# Patient Record
Sex: Female | Born: 1950 | Race: White | Hispanic: No | Marital: Single
Health system: Southern US, Community
[De-identification: ages and names within clinical notes are randomized; demographics above are authoritative.]

---

## 2006-08-21 ENCOUNTER — Other Ambulatory Visit: Admission: RE | Admit: 2006-08-21 | Discharge: 2006-08-21 | Payer: Self-pay | Admitting: *Deleted

## 2011-04-21 ENCOUNTER — Ambulatory Visit (HOSPITAL_COMMUNITY)
Admission: RE | Admit: 2011-04-21 | Discharge: 2011-04-21 | Disposition: A | Discharge status: Home / Self Care | Payer: Self-pay | Source: Ambulatory Visit | Attending: Obstetrics & Gynecology | Admitting: Obstetrics & Gynecology

## 2011-04-21 ENCOUNTER — Other Ambulatory Visit: Payer: Self-pay | Admitting: Obstetrics & Gynecology

## 2011-04-21 DIAGNOSIS — Z01818 Encounter for other preprocedural examination: Secondary | ICD-10-CM

## 2012-04-17 ENCOUNTER — Other Ambulatory Visit (HOSPITAL_COMMUNITY)
Admission: RE | Admit: 2012-04-17 | Discharge: 2012-04-17 | Disposition: A | Discharge status: Home / Self Care | Payer: BC Managed Care – PPO | Source: Ambulatory Visit | Attending: Family Medicine | Admitting: Family Medicine

## 2012-04-17 DIAGNOSIS — Z01419 Encounter for gynecological examination (general) (routine) without abnormal findings: Secondary | ICD-10-CM | POA: Insufficient documentation

## 2013-05-19 ENCOUNTER — Other Ambulatory Visit: Payer: Self-pay | Admitting: Family Medicine

## 2013-05-19 DIAGNOSIS — Z1231 Encounter for screening mammogram for malignant neoplasm of breast: Secondary | ICD-10-CM

## 2013-06-11 ENCOUNTER — Ambulatory Visit
Admission: RE | Admit: 2013-06-11 | Discharge: 2013-06-11 | Disposition: A | Discharge status: Home / Self Care | Payer: BC Managed Care – PPO | Source: Ambulatory Visit | Attending: Family Medicine | Admitting: Family Medicine

## 2013-06-11 DIAGNOSIS — Z1231 Encounter for screening mammogram for malignant neoplasm of breast: Secondary | ICD-10-CM

## 2013-06-13 ENCOUNTER — Other Ambulatory Visit: Payer: Self-pay | Admitting: Family Medicine

## 2013-06-13 DIAGNOSIS — R928 Other abnormal and inconclusive findings on diagnostic imaging of breast: Secondary | ICD-10-CM

## 2013-06-18 ENCOUNTER — Ambulatory Visit
Admission: RE | Admit: 2013-06-18 | Discharge: 2013-06-18 | Disposition: A | Discharge status: Home / Self Care | Payer: BC Managed Care – PPO | Source: Ambulatory Visit | Attending: Family Medicine | Admitting: Family Medicine

## 2013-06-18 ENCOUNTER — Other Ambulatory Visit: Payer: Self-pay | Admitting: Radiology

## 2013-06-18 ENCOUNTER — Other Ambulatory Visit: Payer: Self-pay | Admitting: Family Medicine

## 2013-06-18 DIAGNOSIS — R928 Other abnormal and inconclusive findings on diagnostic imaging of breast: Secondary | ICD-10-CM

## 2013-12-02 ENCOUNTER — Other Ambulatory Visit: Payer: Self-pay | Admitting: Family Medicine

## 2013-12-02 DIAGNOSIS — N6320 Unspecified lump in the left breast, unspecified quadrant: Secondary | ICD-10-CM

## 2013-12-17 ENCOUNTER — Ambulatory Visit
Admission: RE | Admit: 2013-12-17 | Discharge: 2013-12-17 | Disposition: A | Discharge status: Home / Self Care | Payer: BC Managed Care – PPO | Source: Ambulatory Visit | Attending: Family Medicine | Admitting: Family Medicine

## 2013-12-17 ENCOUNTER — Other Ambulatory Visit: Payer: Self-pay | Admitting: Family Medicine

## 2013-12-17 DIAGNOSIS — N6320 Unspecified lump in the left breast, unspecified quadrant: Secondary | ICD-10-CM

## 2015-07-02 ENCOUNTER — Other Ambulatory Visit (HOSPITAL_COMMUNITY)
Admission: RE | Admit: 2015-07-02 | Discharge: 2015-07-02 | Disposition: A | Discharge status: Home / Self Care | Payer: BLUE CROSS/BLUE SHIELD | Source: Ambulatory Visit | Attending: Family Medicine | Admitting: Family Medicine

## 2015-07-02 ENCOUNTER — Other Ambulatory Visit: Payer: Self-pay | Admitting: Family Medicine

## 2015-07-02 DIAGNOSIS — Z1151 Encounter for screening for human papillomavirus (HPV): Secondary | ICD-10-CM | POA: Insufficient documentation

## 2015-07-02 DIAGNOSIS — Z124 Encounter for screening for malignant neoplasm of cervix: Secondary | ICD-10-CM | POA: Diagnosis present

## 2015-07-07 LAB — CYTOLOGY - PAP

## 2016-09-08 DIAGNOSIS — Z1211 Encounter for screening for malignant neoplasm of colon: Secondary | ICD-10-CM | POA: Diagnosis not present

## 2016-09-08 DIAGNOSIS — Z23 Encounter for immunization: Secondary | ICD-10-CM | POA: Diagnosis not present

## 2016-09-08 DIAGNOSIS — E669 Obesity, unspecified: Secondary | ICD-10-CM | POA: Diagnosis not present

## 2016-09-08 DIAGNOSIS — I10 Essential (primary) hypertension: Secondary | ICD-10-CM | POA: Diagnosis not present

## 2016-09-08 DIAGNOSIS — Z0000 Encounter for general adult medical examination without abnormal findings: Secondary | ICD-10-CM | POA: Diagnosis not present

## 2016-09-08 DIAGNOSIS — R7301 Impaired fasting glucose: Secondary | ICD-10-CM | POA: Diagnosis not present

## 2016-09-11 ENCOUNTER — Other Ambulatory Visit: Payer: Self-pay | Admitting: Family Medicine

## 2016-09-11 DIAGNOSIS — N6320 Unspecified lump in the left breast, unspecified quadrant: Secondary | ICD-10-CM

## 2016-10-05 DIAGNOSIS — I10 Essential (primary) hypertension: Secondary | ICD-10-CM | POA: Diagnosis not present

## 2016-10-19 DIAGNOSIS — E871 Hypo-osmolality and hyponatremia: Secondary | ICD-10-CM | POA: Diagnosis not present

## 2016-11-14 DIAGNOSIS — Z780 Asymptomatic menopausal state: Secondary | ICD-10-CM | POA: Diagnosis not present

## 2017-02-20 ENCOUNTER — Other Ambulatory Visit: Payer: Self-pay | Admitting: Family Medicine

## 2017-02-20 DIAGNOSIS — N6320 Unspecified lump in the left breast, unspecified quadrant: Secondary | ICD-10-CM

## 2017-02-21 ENCOUNTER — Ambulatory Visit
Admission: RE | Admit: 2017-02-21 | Discharge: 2017-02-21 | Disposition: A | Discharge status: Home / Self Care | Payer: PPO | Source: Ambulatory Visit | Attending: Family Medicine | Admitting: Family Medicine

## 2017-02-21 ENCOUNTER — Ambulatory Visit: Payer: BLUE CROSS/BLUE SHIELD

## 2017-02-21 DIAGNOSIS — N6320 Unspecified lump in the left breast, unspecified quadrant: Secondary | ICD-10-CM

## 2017-02-21 DIAGNOSIS — R928 Other abnormal and inconclusive findings on diagnostic imaging of breast: Secondary | ICD-10-CM | POA: Diagnosis not present

## 2017-09-26 DIAGNOSIS — I10 Essential (primary) hypertension: Secondary | ICD-10-CM | POA: Diagnosis not present

## 2017-09-26 DIAGNOSIS — Z0000 Encounter for general adult medical examination without abnormal findings: Secondary | ICD-10-CM | POA: Diagnosis not present

## 2017-09-26 DIAGNOSIS — R7301 Impaired fasting glucose: Secondary | ICD-10-CM | POA: Diagnosis not present

## 2017-09-26 DIAGNOSIS — Z1159 Encounter for screening for other viral diseases: Secondary | ICD-10-CM | POA: Diagnosis not present

## 2017-09-26 DIAGNOSIS — Z23 Encounter for immunization: Secondary | ICD-10-CM | POA: Diagnosis not present

## 2018-10-16 DIAGNOSIS — I10 Essential (primary) hypertension: Secondary | ICD-10-CM | POA: Diagnosis not present

## 2018-10-16 DIAGNOSIS — R7301 Impaired fasting glucose: Secondary | ICD-10-CM | POA: Diagnosis not present

## 2018-10-16 DIAGNOSIS — Z0000 Encounter for general adult medical examination without abnormal findings: Secondary | ICD-10-CM | POA: Diagnosis not present

## 2018-11-04 DIAGNOSIS — N179 Acute kidney failure, unspecified: Secondary | ICD-10-CM | POA: Diagnosis not present

## 2018-11-20 DIAGNOSIS — I471 Supraventricular tachycardia: Secondary | ICD-10-CM | POA: Diagnosis not present

## 2018-11-20 DIAGNOSIS — I10 Essential (primary) hypertension: Secondary | ICD-10-CM | POA: Diagnosis not present

## 2018-11-20 DIAGNOSIS — I472 Ventricular tachycardia: Secondary | ICD-10-CM | POA: Diagnosis not present

## 2019-07-16 ENCOUNTER — Other Ambulatory Visit: Payer: Self-pay | Admitting: Family Medicine

## 2019-07-16 DIAGNOSIS — Z1231 Encounter for screening mammogram for malignant neoplasm of breast: Secondary | ICD-10-CM

## 2019-10-30 DIAGNOSIS — I10 Essential (primary) hypertension: Secondary | ICD-10-CM | POA: Diagnosis not present

## 2019-10-30 DIAGNOSIS — Z1211 Encounter for screening for malignant neoplasm of colon: Secondary | ICD-10-CM | POA: Diagnosis not present

## 2019-10-30 DIAGNOSIS — Z0000 Encounter for general adult medical examination without abnormal findings: Secondary | ICD-10-CM | POA: Diagnosis not present

## 2019-10-30 DIAGNOSIS — R7301 Impaired fasting glucose: Secondary | ICD-10-CM | POA: Diagnosis not present

## 2019-12-31 ENCOUNTER — Ambulatory Visit
Admission: RE | Admit: 2019-12-31 | Discharge: 2019-12-31 | Disposition: A | Discharge status: Home / Self Care | Payer: PPO | Source: Ambulatory Visit | Attending: Family Medicine | Admitting: Family Medicine

## 2019-12-31 ENCOUNTER — Other Ambulatory Visit: Payer: Self-pay

## 2019-12-31 DIAGNOSIS — Z1231 Encounter for screening mammogram for malignant neoplasm of breast: Secondary | ICD-10-CM | POA: Diagnosis not present

## 2020-07-02 DIAGNOSIS — Z23 Encounter for immunization: Secondary | ICD-10-CM | POA: Diagnosis not present

## 2020-07-02 DIAGNOSIS — M25559 Pain in unspecified hip: Secondary | ICD-10-CM | POA: Diagnosis not present

## 2020-07-02 DIAGNOSIS — I10 Essential (primary) hypertension: Secondary | ICD-10-CM | POA: Diagnosis not present

## 2020-11-09 ENCOUNTER — Other Ambulatory Visit: Payer: Self-pay | Admitting: Family Medicine

## 2020-11-09 DIAGNOSIS — Z1231 Encounter for screening mammogram for malignant neoplasm of breast: Secondary | ICD-10-CM

## 2020-11-09 DIAGNOSIS — I10 Essential (primary) hypertension: Secondary | ICD-10-CM | POA: Diagnosis not present

## 2020-11-09 DIAGNOSIS — Z1211 Encounter for screening for malignant neoplasm of colon: Secondary | ICD-10-CM | POA: Diagnosis not present

## 2020-11-09 DIAGNOSIS — R7301 Impaired fasting glucose: Secondary | ICD-10-CM | POA: Diagnosis not present

## 2020-11-09 DIAGNOSIS — Z0000 Encounter for general adult medical examination without abnormal findings: Secondary | ICD-10-CM | POA: Diagnosis not present

## 2020-11-09 DIAGNOSIS — M545 Low back pain, unspecified: Secondary | ICD-10-CM | POA: Diagnosis not present

## 2020-11-16 DIAGNOSIS — R2689 Other abnormalities of gait and mobility: Secondary | ICD-10-CM | POA: Diagnosis not present

## 2020-11-16 DIAGNOSIS — M6281 Muscle weakness (generalized): Secondary | ICD-10-CM | POA: Diagnosis not present

## 2020-11-16 DIAGNOSIS — M25551 Pain in right hip: Secondary | ICD-10-CM | POA: Diagnosis not present

## 2020-11-19 DIAGNOSIS — M25551 Pain in right hip: Secondary | ICD-10-CM | POA: Diagnosis not present

## 2020-11-19 DIAGNOSIS — R2689 Other abnormalities of gait and mobility: Secondary | ICD-10-CM | POA: Diagnosis not present

## 2020-11-19 DIAGNOSIS — M6281 Muscle weakness (generalized): Secondary | ICD-10-CM | POA: Diagnosis not present

## 2020-11-23 DIAGNOSIS — R2689 Other abnormalities of gait and mobility: Secondary | ICD-10-CM | POA: Diagnosis not present

## 2020-11-23 DIAGNOSIS — M6281 Muscle weakness (generalized): Secondary | ICD-10-CM | POA: Diagnosis not present

## 2020-11-23 DIAGNOSIS — M25551 Pain in right hip: Secondary | ICD-10-CM | POA: Diagnosis not present

## 2020-11-25 DIAGNOSIS — R2689 Other abnormalities of gait and mobility: Secondary | ICD-10-CM | POA: Diagnosis not present

## 2020-11-25 DIAGNOSIS — M6281 Muscle weakness (generalized): Secondary | ICD-10-CM | POA: Diagnosis not present

## 2020-11-25 DIAGNOSIS — M25551 Pain in right hip: Secondary | ICD-10-CM | POA: Diagnosis not present

## 2020-11-30 DIAGNOSIS — R2689 Other abnormalities of gait and mobility: Secondary | ICD-10-CM | POA: Diagnosis not present

## 2020-11-30 DIAGNOSIS — M25551 Pain in right hip: Secondary | ICD-10-CM | POA: Diagnosis not present

## 2020-11-30 DIAGNOSIS — M6281 Muscle weakness (generalized): Secondary | ICD-10-CM | POA: Diagnosis not present

## 2020-12-07 DIAGNOSIS — R2689 Other abnormalities of gait and mobility: Secondary | ICD-10-CM | POA: Diagnosis not present

## 2020-12-07 DIAGNOSIS — M6281 Muscle weakness (generalized): Secondary | ICD-10-CM | POA: Diagnosis not present

## 2020-12-07 DIAGNOSIS — M25551 Pain in right hip: Secondary | ICD-10-CM | POA: Diagnosis not present

## 2020-12-21 DIAGNOSIS — R2689 Other abnormalities of gait and mobility: Secondary | ICD-10-CM | POA: Diagnosis not present

## 2020-12-21 DIAGNOSIS — M25551 Pain in right hip: Secondary | ICD-10-CM | POA: Diagnosis not present

## 2020-12-21 DIAGNOSIS — M6281 Muscle weakness (generalized): Secondary | ICD-10-CM | POA: Diagnosis not present

## 2020-12-30 ENCOUNTER — Ambulatory Visit
Admission: RE | Admit: 2020-12-30 | Discharge: 2020-12-30 | Disposition: A | Discharge status: Home / Self Care | Payer: PPO | Source: Ambulatory Visit | Attending: Family Medicine | Admitting: Family Medicine

## 2020-12-30 ENCOUNTER — Other Ambulatory Visit: Payer: Self-pay

## 2020-12-30 DIAGNOSIS — Z1231 Encounter for screening mammogram for malignant neoplasm of breast: Secondary | ICD-10-CM

## 2020-12-31 ENCOUNTER — Ambulatory Visit: Payer: PPO

## 2020-12-31 ENCOUNTER — Ambulatory Visit
Admission: RE | Admit: 2020-12-31 | Discharge: 2020-12-31 | Disposition: A | Discharge status: Home / Self Care | Payer: PPO | Source: Ambulatory Visit | Attending: Family Medicine | Admitting: Family Medicine

## 2020-12-31 ENCOUNTER — Other Ambulatory Visit: Payer: Self-pay

## 2020-12-31 DIAGNOSIS — Z1231 Encounter for screening mammogram for malignant neoplasm of breast: Secondary | ICD-10-CM | POA: Diagnosis not present

## 2021-01-01 ENCOUNTER — Ambulatory Visit: Payer: PPO

## 2021-01-04 DIAGNOSIS — M6281 Muscle weakness (generalized): Secondary | ICD-10-CM | POA: Diagnosis not present

## 2021-01-04 DIAGNOSIS — R2689 Other abnormalities of gait and mobility: Secondary | ICD-10-CM | POA: Diagnosis not present

## 2021-01-04 DIAGNOSIS — M25551 Pain in right hip: Secondary | ICD-10-CM | POA: Diagnosis not present

## 2021-01-18 DIAGNOSIS — M6281 Muscle weakness (generalized): Secondary | ICD-10-CM | POA: Diagnosis not present

## 2021-01-18 DIAGNOSIS — R2689 Other abnormalities of gait and mobility: Secondary | ICD-10-CM | POA: Diagnosis not present

## 2021-01-18 DIAGNOSIS — M25551 Pain in right hip: Secondary | ICD-10-CM | POA: Diagnosis not present

## 2021-02-09 DIAGNOSIS — M25551 Pain in right hip: Secondary | ICD-10-CM | POA: Diagnosis not present

## 2021-02-09 DIAGNOSIS — R2689 Other abnormalities of gait and mobility: Secondary | ICD-10-CM | POA: Diagnosis not present

## 2021-02-09 DIAGNOSIS — M6281 Muscle weakness (generalized): Secondary | ICD-10-CM | POA: Diagnosis not present

## 2021-02-22 DIAGNOSIS — R262 Difficulty in walking, not elsewhere classified: Secondary | ICD-10-CM | POA: Diagnosis not present

## 2021-02-22 DIAGNOSIS — M25551 Pain in right hip: Secondary | ICD-10-CM | POA: Diagnosis not present

## 2021-02-22 DIAGNOSIS — M6281 Muscle weakness (generalized): Secondary | ICD-10-CM | POA: Diagnosis not present

## 2021-08-05 DIAGNOSIS — M4807 Spinal stenosis, lumbosacral region: Secondary | ICD-10-CM | POA: Diagnosis not present

## 2021-08-05 DIAGNOSIS — M6281 Muscle weakness (generalized): Secondary | ICD-10-CM | POA: Diagnosis not present

## 2021-08-05 DIAGNOSIS — R2689 Other abnormalities of gait and mobility: Secondary | ICD-10-CM | POA: Diagnosis not present

## 2021-08-05 DIAGNOSIS — M25551 Pain in right hip: Secondary | ICD-10-CM | POA: Diagnosis not present

## 2021-08-19 DIAGNOSIS — M6281 Muscle weakness (generalized): Secondary | ICD-10-CM | POA: Diagnosis not present

## 2021-08-19 DIAGNOSIS — M25551 Pain in right hip: Secondary | ICD-10-CM | POA: Diagnosis not present

## 2021-08-19 DIAGNOSIS — R2689 Other abnormalities of gait and mobility: Secondary | ICD-10-CM | POA: Diagnosis not present

## 2021-08-19 DIAGNOSIS — M4807 Spinal stenosis, lumbosacral region: Secondary | ICD-10-CM | POA: Diagnosis not present

## 2021-09-02 DIAGNOSIS — M6281 Muscle weakness (generalized): Secondary | ICD-10-CM | POA: Diagnosis not present

## 2021-09-02 DIAGNOSIS — M4807 Spinal stenosis, lumbosacral region: Secondary | ICD-10-CM | POA: Diagnosis not present

## 2021-09-02 DIAGNOSIS — R2689 Other abnormalities of gait and mobility: Secondary | ICD-10-CM | POA: Diagnosis not present

## 2021-09-02 DIAGNOSIS — M25551 Pain in right hip: Secondary | ICD-10-CM | POA: Diagnosis not present

## 2021-12-16 DIAGNOSIS — R7301 Impaired fasting glucose: Secondary | ICD-10-CM | POA: Diagnosis not present

## 2021-12-16 DIAGNOSIS — Z0000 Encounter for general adult medical examination without abnormal findings: Secondary | ICD-10-CM | POA: Diagnosis not present

## 2021-12-16 DIAGNOSIS — I10 Essential (primary) hypertension: Secondary | ICD-10-CM | POA: Diagnosis not present

## 2021-12-16 DIAGNOSIS — M545 Low back pain, unspecified: Secondary | ICD-10-CM | POA: Diagnosis not present

## 2021-12-26 ENCOUNTER — Other Ambulatory Visit: Payer: Self-pay | Admitting: Family Medicine

## 2021-12-26 DIAGNOSIS — Z1231 Encounter for screening mammogram for malignant neoplasm of breast: Secondary | ICD-10-CM

## 2022-02-20 DIAGNOSIS — W312XXA Contact with powered woodworking and forming machines, initial encounter: Secondary | ICD-10-CM | POA: Diagnosis not present

## 2022-02-20 DIAGNOSIS — S50812A Abrasion of left forearm, initial encounter: Secondary | ICD-10-CM | POA: Diagnosis not present

## 2022-02-20 DIAGNOSIS — Z23 Encounter for immunization: Secondary | ICD-10-CM | POA: Diagnosis not present

## 2022-03-11 DIAGNOSIS — N3001 Acute cystitis with hematuria: Secondary | ICD-10-CM | POA: Diagnosis not present

## 2022-09-11 ENCOUNTER — Other Ambulatory Visit: Payer: Self-pay | Admitting: Family Medicine

## 2022-09-11 DIAGNOSIS — Z1231 Encounter for screening mammogram for malignant neoplasm of breast: Secondary | ICD-10-CM

## 2022-09-11 DIAGNOSIS — E2839 Other primary ovarian failure: Secondary | ICD-10-CM

## 2022-12-10 IMAGING — MG MM DIGITAL SCREENING BILAT W/ TOMO AND CAD
8 series · 8 of 24 positions shown · non-contrast
Comparison: Previous exam(s).

CLINICAL DATA: Screening.

EXAM:
DIGITAL SCREENING BILATERAL MAMMOGRAM WITH TOMOSYNTHESIS AND CAD
TECHNIQUE: Bilateral screening digital craniocaudal and mediolateral oblique
mammograms were obtained. Bilateral screening digital breast
tomosynthesis was performed. The images were evaluated with
computer-aided detection.

[R CC synth-2D]
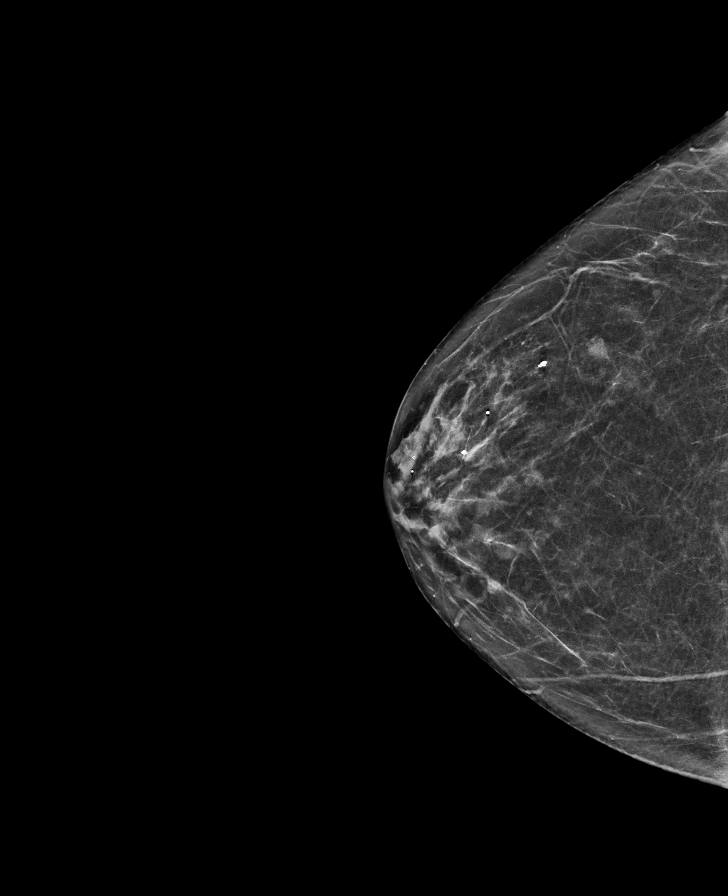

[L CC synth-2D]
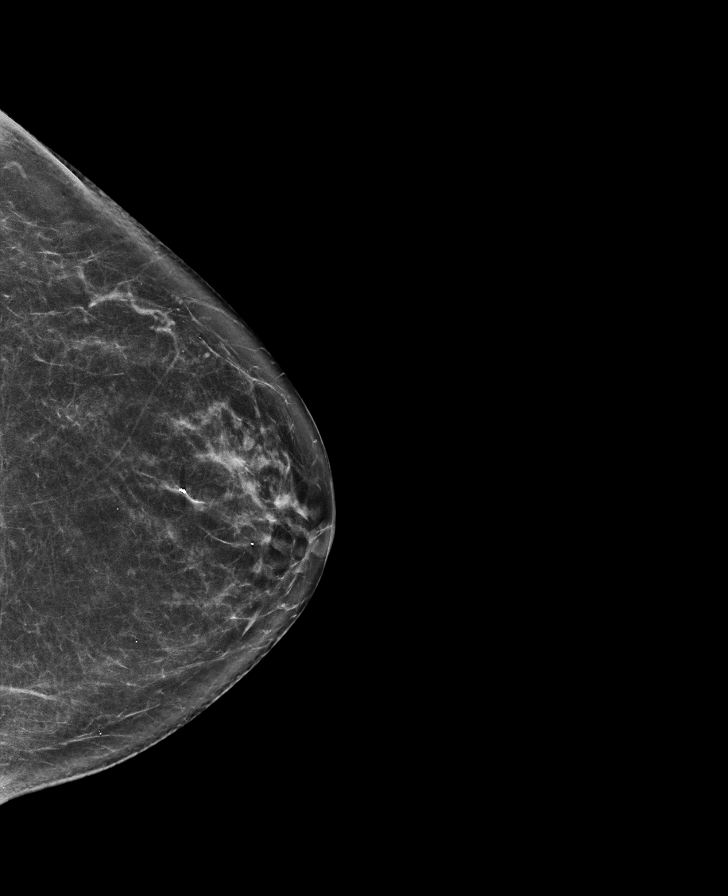

[L MLO synth-2D]
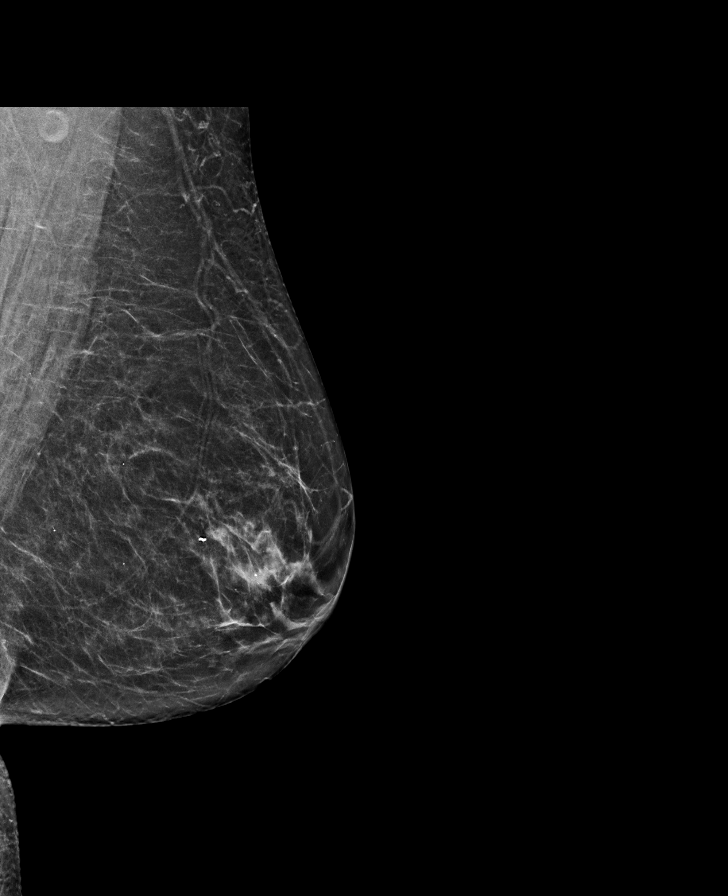

[R MLO synth-2D]
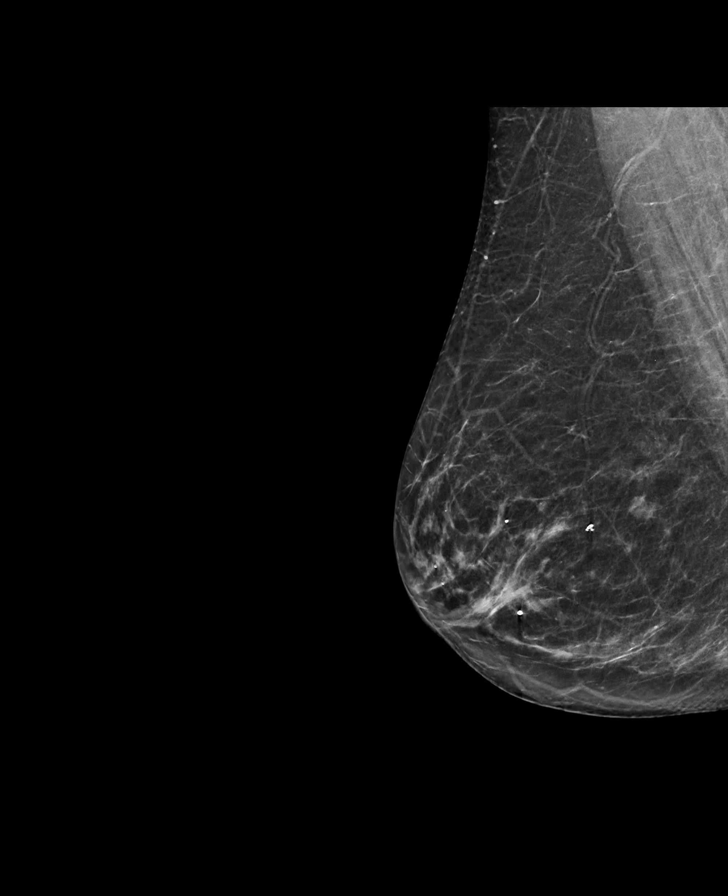

[L MLO tomo · tomo slice 35/69.0]
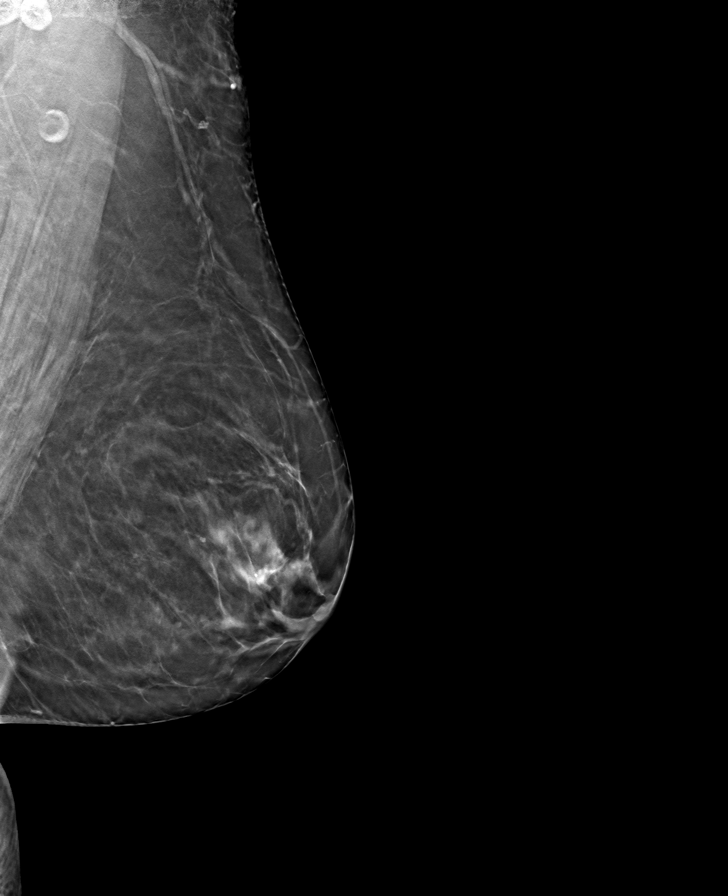

[L CC tomo · tomo slice 35/68.0]
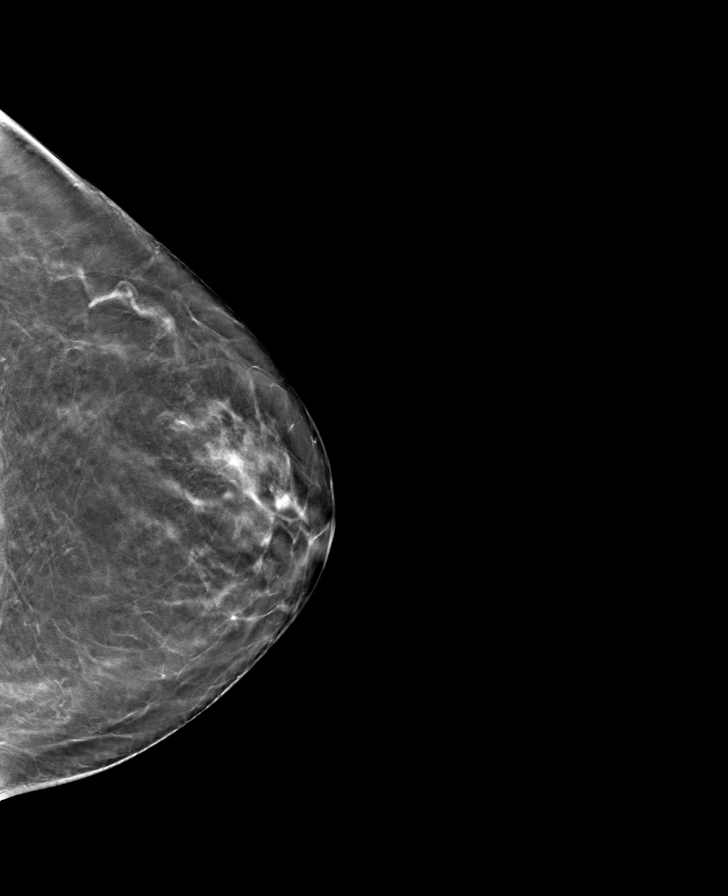

[R CC tomo · tomo slice 31/60.0]
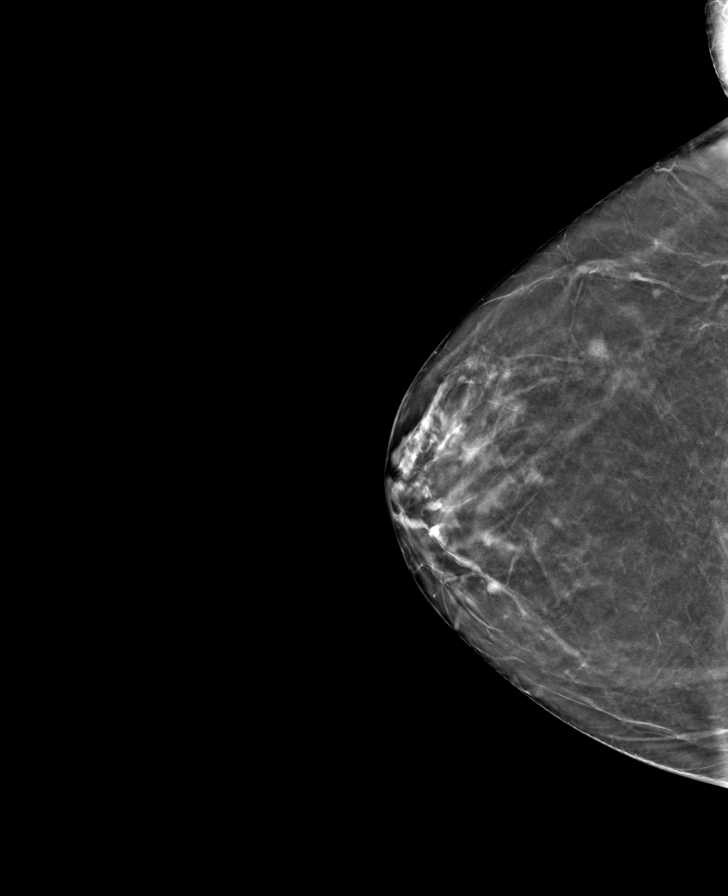

[R MLO tomo · tomo slice 33/65.0]
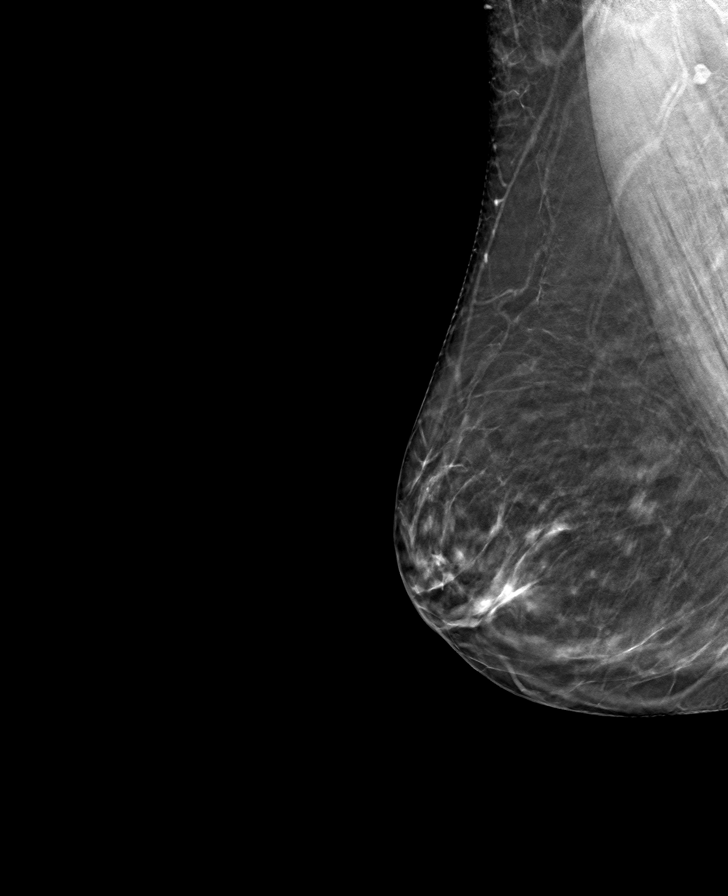

[8 of 24 positions shown; findings below may reference images not displayed]

ACR Breast Density Category b: There are scattered areas of
fibroglandular density.
FINDINGS: There are no findings suspicious for malignancy. The images were
evaluated with computer-aided detection.
IMPRESSION: No mammographic evidence of malignancy. A result letter of this
screening mammogram will be mailed directly to the patient.

RECOMMENDATION:
Screening mammogram in one year. (Code:[id number])

BI-RADS CATEGORY  1: Negative.

## 2022-12-25 DIAGNOSIS — Z0000 Encounter for general adult medical examination without abnormal findings: Secondary | ICD-10-CM | POA: Diagnosis not present

## 2022-12-25 DIAGNOSIS — L304 Erythema intertrigo: Secondary | ICD-10-CM | POA: Diagnosis not present

## 2022-12-25 DIAGNOSIS — R7301 Impaired fasting glucose: Secondary | ICD-10-CM | POA: Diagnosis not present

## 2022-12-25 DIAGNOSIS — I10 Essential (primary) hypertension: Secondary | ICD-10-CM | POA: Diagnosis not present

## 2023-02-13 ENCOUNTER — Ambulatory Visit
Admission: RE | Admit: 2023-02-13 | Discharge: 2023-02-13 | Disposition: A | Discharge status: Home / Self Care | Payer: PPO | Source: Ambulatory Visit | Attending: Family Medicine | Admitting: Family Medicine

## 2023-02-13 DIAGNOSIS — Z1231 Encounter for screening mammogram for malignant neoplasm of breast: Secondary | ICD-10-CM

## 2023-02-13 DIAGNOSIS — N958 Other specified menopausal and perimenopausal disorders: Secondary | ICD-10-CM | POA: Diagnosis not present

## 2023-02-13 DIAGNOSIS — E2839 Other primary ovarian failure: Secondary | ICD-10-CM

## 2023-02-13 DIAGNOSIS — E349 Endocrine disorder, unspecified: Secondary | ICD-10-CM | POA: Diagnosis not present

## 2023-12-28 DIAGNOSIS — M8588 Other specified disorders of bone density and structure, other site: Secondary | ICD-10-CM | POA: Diagnosis not present

## 2023-12-28 DIAGNOSIS — R7301 Impaired fasting glucose: Secondary | ICD-10-CM | POA: Diagnosis not present

## 2023-12-28 DIAGNOSIS — Z0000 Encounter for general adult medical examination without abnormal findings: Secondary | ICD-10-CM | POA: Diagnosis not present

## 2023-12-28 DIAGNOSIS — Z1331 Encounter for screening for depression: Secondary | ICD-10-CM | POA: Diagnosis not present

## 2023-12-28 DIAGNOSIS — I10 Essential (primary) hypertension: Secondary | ICD-10-CM | POA: Diagnosis not present

## 2024-02-06 DIAGNOSIS — H25813 Combined forms of age-related cataract, bilateral: Secondary | ICD-10-CM | POA: Diagnosis not present

## 2024-02-06 DIAGNOSIS — H1045 Other chronic allergic conjunctivitis: Secondary | ICD-10-CM | POA: Diagnosis not present

## 2024-02-06 DIAGNOSIS — H40013 Open angle with borderline findings, low risk, bilateral: Secondary | ICD-10-CM | POA: Diagnosis not present

## 2024-04-30 DIAGNOSIS — H52223 Regular astigmatism, bilateral: Secondary | ICD-10-CM | POA: Diagnosis not present

## 2024-04-30 DIAGNOSIS — H25813 Combined forms of age-related cataract, bilateral: Secondary | ICD-10-CM | POA: Diagnosis not present

## 2024-04-30 DIAGNOSIS — H40013 Open angle with borderline findings, low risk, bilateral: Secondary | ICD-10-CM | POA: Diagnosis not present

## 2024-05-12 DIAGNOSIS — H25813 Combined forms of age-related cataract, bilateral: Secondary | ICD-10-CM | POA: Diagnosis not present

## 2024-05-27 DIAGNOSIS — Z888 Allergy status to other drugs, medicaments and biological substances status: Secondary | ICD-10-CM | POA: Diagnosis not present

## 2024-05-27 DIAGNOSIS — H2511 Age-related nuclear cataract, right eye: Secondary | ICD-10-CM | POA: Diagnosis not present

## 2024-05-27 DIAGNOSIS — H25811 Combined forms of age-related cataract, right eye: Secondary | ICD-10-CM | POA: Diagnosis not present

## 2024-05-27 DIAGNOSIS — Z7982 Long term (current) use of aspirin: Secondary | ICD-10-CM | POA: Diagnosis not present

## 2024-05-27 DIAGNOSIS — Z881 Allergy status to other antibiotic agents status: Secondary | ICD-10-CM | POA: Diagnosis not present

## 2024-05-27 DIAGNOSIS — H25813 Combined forms of age-related cataract, bilateral: Secondary | ICD-10-CM | POA: Diagnosis not present

## 2024-05-27 DIAGNOSIS — Z880 Allergy status to penicillin: Secondary | ICD-10-CM | POA: Diagnosis not present

## 2024-05-27 DIAGNOSIS — H52223 Regular astigmatism, bilateral: Secondary | ICD-10-CM | POA: Diagnosis not present

## 2024-05-27 DIAGNOSIS — Z79899 Other long term (current) drug therapy: Secondary | ICD-10-CM | POA: Diagnosis not present

## 2024-05-27 DIAGNOSIS — I10 Essential (primary) hypertension: Secondary | ICD-10-CM | POA: Diagnosis not present

## 2024-06-03 DIAGNOSIS — Z79899 Other long term (current) drug therapy: Secondary | ICD-10-CM | POA: Diagnosis not present

## 2024-06-03 DIAGNOSIS — Z7982 Long term (current) use of aspirin: Secondary | ICD-10-CM | POA: Diagnosis not present

## 2024-06-03 DIAGNOSIS — H25812 Combined forms of age-related cataract, left eye: Secondary | ICD-10-CM | POA: Diagnosis not present

## 2024-06-03 DIAGNOSIS — H25813 Combined forms of age-related cataract, bilateral: Secondary | ICD-10-CM | POA: Diagnosis not present

## 2024-06-03 DIAGNOSIS — Z880 Allergy status to penicillin: Secondary | ICD-10-CM | POA: Diagnosis not present

## 2024-06-03 DIAGNOSIS — H52223 Regular astigmatism, bilateral: Secondary | ICD-10-CM | POA: Diagnosis not present

## 2024-06-03 DIAGNOSIS — I10 Essential (primary) hypertension: Secondary | ICD-10-CM | POA: Diagnosis not present

## 2024-06-03 DIAGNOSIS — R030 Elevated blood-pressure reading, without diagnosis of hypertension: Secondary | ICD-10-CM | POA: Diagnosis not present

## 2024-06-03 DIAGNOSIS — Z881 Allergy status to other antibiotic agents status: Secondary | ICD-10-CM | POA: Diagnosis not present

## 2024-06-03 DIAGNOSIS — H2512 Age-related nuclear cataract, left eye: Secondary | ICD-10-CM | POA: Diagnosis not present

## 2024-06-03 DIAGNOSIS — Z888 Allergy status to other drugs, medicaments and biological substances status: Secondary | ICD-10-CM | POA: Diagnosis not present
